# Patient Record
Sex: Male | Born: 2000
Health system: Southern US, Community
[De-identification: ages and names within clinical notes are randomized; demographics above are authoritative.]

## PROBLEM LIST (undated history)

## (undated) ENCOUNTER — Ambulatory Visit: Admission: EM | Payer: Self-pay | Source: Home / Self Care

## (undated) DIAGNOSIS — F913 Oppositional defiant disorder: Secondary | ICD-10-CM

## (undated) DIAGNOSIS — M24549 Contracture, unspecified hand: Secondary | ICD-10-CM

## (undated) DIAGNOSIS — M66849 Spontaneous rupture of other tendons, unspecified hand: Secondary | ICD-10-CM

## (undated) DIAGNOSIS — F819 Developmental disorder of scholastic skills, unspecified: Secondary | ICD-10-CM

## (undated) DIAGNOSIS — F909 Attention-deficit hyperactivity disorder, unspecified type: Secondary | ICD-10-CM

---
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
## 2018-01-28 ENCOUNTER — Ambulatory Visit (INDEPENDENT_AMBULATORY_CARE_PROVIDER_SITE_OTHER): Payer: Self-pay | Admitting: Family Medicine

## 2018-01-28 ENCOUNTER — Encounter: Payer: Self-pay | Admitting: Family Medicine

## 2018-01-28 VITALS — BP 137/60 | HR 60 | Temp 98.0°F | Wt 138.0 lb

## 2018-01-28 DIAGNOSIS — R197 Diarrhea, unspecified: Secondary | ICD-10-CM

## 2018-01-28 NOTE — Progress Notes (Signed)
       Samuel Yates is a 17 y.o. male who presents to Samuel Yates: [facility]ates today for diarrhea.  Samuel Yates has a 2-day history of diarrhea.  He is having approximately 5 watery stools per day.  He denies significant abdominal pain blood in the stool or vomiting.  He notes a little bit of abdominal cramping.  He notes symptoms are worse after he eats.  No sick contacts.  No fevers or chills.  No treatment tried yet.  He is having difficulty working.  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  He is missed work yesterday and today.   ROS as above:  Exam:  BP 137/60   Pulse 60   Temp 98 F (36.7 C) (Oral)   Wt 138 lb (62.6 kg)   BMI 22.27 kg/m  Wt Readings from Last 5 Encounters:  01/28/18 138 lb (62.6 kg) (25 %, Z= -0.67)*  12/06/17 130 lb (59 kg) (14 %, Z= -1.07)*  11/29/17 133 lb (60.3 kg) (18 %, Z= -0.91)*  10/30/17 136 lb (61.7 kg) (23 %, Z= -0.73)*  09/16/17 135 lb (61.2 kg) (22 %, Z= -0.76)*   * Growth percentiles are based on CDC (Boys, 2-20 Years) data.    Gen: Well NAD HEENT: EOMI,  MMM Lungs: Normal work of breathing. CTABL Heart: RRR no MRG Abd: NABS, Soft. Nondistended, Nontender no rebound or guarding.  No masses palpated. Exts: Brisk capillary refill, warm and well perfused.      Assessment and Plan: 17 y.o. male with viral enteritis.  Plan for treatment symptomatically with loperamide.  If not improving patient will let me know and I will send in Lomotil.  Recheck as needed.  Work note provided.   No orders of the defined types were placed in this encounter.  No orders of the defined types were placed in this encounter.    Historical information moved to improve visibility of documentation.  Past Medical History:  Diagnosis Date  . ADHD (attention deficit hyperactivity disorder)   . 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 Nontraumatic rupture of tendon of finger 08/2012   pulley rupture right small finger  . Oppositional defiant disorder    Past Surgical History:  Procedure Laterality Date  . HAND TENDON SURGERY Right 2010  . ORIF FINGER FRACTURE  12/06/2011   Procedure: OPEN REDUCTION INTERNAL FIXATION (ORIF) METACARPAL (FINGER) FRACTURE;  Surgeon: Samuel Ribas, MD;  Location: [facility];  Service: Orthopedics;  Laterality: Right;  RIGHT LONG AND RING METACARPAL FRACTURES  . TRIGGER FINGER RELEASE Right 09/09/2012   Procedure: RIGHT SMALL FINGER PULLEY RECONSTRUCTION;  Surgeon: Samuel Ribas, MD;  Location: [facility];  Service: Orthopedics;  Laterality: Right;   Social History   Tobacco Use  . Smoking status: Former Games developer  . Smokeless tobacco: Never Used  . Tobacco comment: mother smokes outside  Substance Use Topics  . Alcohol use: No   family history includes Asthma in his brother; Diabetes in his maternal grandfather; Heart disease in his maternal grandfather; Hypertension in his maternal grandfather and maternal grandmother.  Medications: No current outpatient medications on file.   No current facility-administered medications for this visit.    No Known Allergies   Discussed warning signs or symptoms. Please see discharge instructions. Patient expresses understanding.

## 2018-01-28 NOTE — Patient Instructions (Signed)
Thank you for coming in today. Take immodium. Get plenty of fluids.  Let me know if not doing well or getting better.  If your belly pain worsens, or you have high fever, bad vomiting, blood in your stool or black tarry stool go to the Emergency Room.    Viral Gastroenteritis, Adult Viral gastroenteritis is also known as the stomach flu. This condition is caused by certain germs (viruses). These germs can be passed from person to person very easily (are very contagious). This condition can cause sudden watery poop (diarrhea), fever, and throwing up (vomiting). Having watery poop and throwing up can make you feel weak and cause you to get dehydrated. Dehydration can make you tired and thirsty, make you have a dry mouth, and make it so you pee (urinate) less often. Older adults and people with other diseases or a weak defense system (immune system) are at higher risk for dehydration. It is important to replace the fluids that you lose from having watery poop and throwing up. Follow these instructions at home: Follow instructions from your doctor about how to care for yourself at home. Eating and drinking  Follow these instructions as told by your doctor:  Take an oral rehydration solution (ORS). This is a drink that is sold at pharmacies and stores.  Drink clear fluids in small amounts as you are able, such as: ? Water. ? Ice chips. ? Diluted fruit juice. ? Low-calorie sports drinks.  Eat bland, easy-to-digest foods in small amounts as you are able, such as: ? Bananas. ? Applesauce. ? Rice. ? Low-fat (lean) meats. ? Toast. ? Crackers.  Avoid fluids that have a lot of sugar or caffeine in them.  Avoid alcohol.  Avoid spicy or fatty foods.  General instructions  Drink enough fluid to keep your pee (urine) clear or pale yellow.  Wash your hands often. If you cannot use soap and water, use hand sanitizer.  Make sure that all people in your home wash their hands well and  often.  Rest at home while you get better.  Take over-the-counter and prescription medicines only as told by your doctor.  Watch your condition for any changes.  Take a warm bath to help with any burning or pain from having watery poop.  Keep all follow-up visits as told by your doctor. This is important. Contact a doctor if:  You cannot keep fluids down.  Your symptoms get worse.  You have new symptoms.  You feel light-headed or dizzy.  You have muscle cramps. Get help right away if:  You have chest pain.  You feel very weak or you pass out (faint).  You see blood in your throw-up.  Your throw-up looks like coffee grounds.  You have bloody or black poop (stools) or poop that look like tar.  You have a very bad headache, a stiff neck, or both.  You have a rash.  You have very bad pain, cramping, or bloating in your belly (abdomen).  You have trouble breathing.  You are breathing very quickly.  Your heart is beating very quickly.  Your skin feels cold and clammy.  You feel confused.  You have pain when you pee.  You have signs of dehydration, such as: ? Dark pee, hardly any pee, or no pee. ? Cracked lips. ? Dry mouth. ? Sunken eyes. ? Sleepiness. ? Weakness. This information is not intended to replace advice given to you by your health care provider. Make sure you discuss any questions you  have with your health care provider. Document Released: 10/24/2007 Document Revised: 11/25/2015 Document Reviewed: 01/11/2015 Elsevier Interactive Patient Education  2017 Elsevier Inc.   Loperamide tablets or capsules What is this medicine? LOPERAMIDE (loe PER a mide) is used to treat diarrhea. This medicine may be used for other purposes; ask your health care provider or pharmacist if you have questions. COMMON BRAND NAME(S): Anti-Diarrheal, Imodium A-D, K-Pek II What should I tell my health care provider before I take this medicine? They need to know if you  have any of these conditions: -a black or bloody stool -bacterial food poisoning -colitis or mucus in your stool -currently taking an antibiotic medication for an infection -fever -liver disease -severe abdominal pain, swelling or bulging -an unusual or allergic reaction to loperamide, other medicines, foods, dyes, or preservatives -pregnant or trying to get pregnant -breast-feeding How should I use this medicine? Take this medicine by mouth with a glass of water. Follow the directions on the prescription label. Take your doses at regular intervals. Do not take your medicine more often than directed. Talk to your pediatrician regarding the use of this medicine in children. Special care may be needed. Overdosage: If you think you have taken too much of this medicine contact a poison control center or emergency room at once. NOTE: This medicine is only for you. Do not share this medicine with others. What if I miss a dose? This does not apply. This medicine is not for regular use. Only take this medicine while you continue to have loose bowel movements. Do not take more medicine than recommended by the packaging label or by your healthcare professional. What may interact with this medicine? Do not take this medicine with any of the following medications: - alosetron This medicine may also interact with the following medications: -cimetidine -clarithromycin -erythromycin -gemfibrozil -itraconazole -ketoconazole -quinidine -quinine -ranitidine -ritonavir -saquinavir This list may not describe all possible interactions. Give your health care provider a list of all the medicines, herbs, non-prescription drugs, or dietary supplements you use. Also tell them if you smoke, drink alcohol, or use illegal drugs. Some items may interact with your medicine. What should I watch for while using this medicine? Do not take this medicine for more than 2 days without asking your doctor or health care  professional. Do not use doses higher than those prescribed by your doctor or listed on the label. Check with your doctor or health care professional right away if you develop a fever, severe abdominal pain, swelling or bulging, or if you have have bloody/black diarrhea or stools. You may get drowsy or dizzy. Do not drive, use machinery, or do anything that needs mental alertness until you know how this medicine affects you. Do not stand or sit up quickly, especially if you are an older patient. This reduces the risk of dizzy or fainting spells. Alcohol can increase possible drowsiness and dizziness. Avoid alcoholic drinks. Your mouth may get dry. Chewing sugarless gum or sucking hard candy, and drinking plenty of water may help. Contact your doctor if the problem does not go away or is severe. Drinking plenty of water can also help prevent dehydration that can occur with diarrhea. Elderly patients may have a more variable response to the effects of this medicine, and are more susceptible to the effects of dehydration. What side effects may I notice from receiving this medicine? Side effects that you should report to your doctor or health care professional as soon as possible: - allergic reactions like  skin rash, itching or hives, swelling of the face, lips, or tongue -bloated, swollen feeling in your abdomen -blurred vision -loss of appetite -signs and symptoms of a dangerous change in heartbeat or heart rhythm like chest pain; dizziness; fast or irregular heartbeat palpitations; feeling faint or lightheaded, falls; breathing problems -stomach pain Side effects that usually do not require medical attention (report to your doctor or health care professional if they continue or are bothersome): - constipation -drowsiness or dizziness -dry mouth -nausea, vomiting This list may not describe all possible side effects. Call your doctor for medical advice about side effects. You may report side effects  to FDA at 1-800-FDA-1088. Where should I keep my medicine? Keep out of the reach of children. Store at room temperature between 15 and 25 degrees C (59 and 77 degrees F). Keep container tightly closed. Throw away any unused medicine after the expiration date. NOTE: This sheet is a summary. It may not cover all possible information. If you have questions about this medicine, talk to your doctor, pharmacist, or health care provider.  2018 Elsevier/Gold Standard (2014-11-02 15:29:29)

## 2018-01-29 ENCOUNTER — Telehealth: Payer: Self-pay | Admitting: Family Medicine

## 2018-01-29 MED ORDER — DOXYCYCLINE HYCLATE 100 MG PO TABS: 100.0000 mg | ORAL_TABLET | Freq: Two times a day (BID) | ORAL | 0 refills | Status: DC

## 2018-01-29 MED FILL — DOXYCYCLINE HYCLATE 100 MG: 100 | 7 days supply | Qty: 14 | Fill #0

## 2018-01-29 NOTE — Telephone Encounter (Signed)
Rt index finger infection.  Tx doxycycline

## 2020-05-23 ENCOUNTER — Other Ambulatory Visit: Payer: Self-pay | Admitting: Physician Assistant

## 2020-05-23 MED ORDER — DOXYCYCLINE HYCLATE 100 MG PO TABS: 100.0000 mg | ORAL_TABLET | Freq: Two times a day (BID) | ORAL | 0 refills | Status: DC

## 2020-05-23 MED FILL — DOXYCYCLINE HYCLATE 100 MG: 100 | 7 days supply | Qty: 14 | Fill #0

## 2020-05-23 NOTE — Progress Notes (Signed)
Partner has chlamydia. Sent doxycycline. No sex for 7 days. Recheck in 1 month.

## 2020-06-14 ENCOUNTER — Ambulatory Visit (INDEPENDENT_AMBULATORY_CARE_PROVIDER_SITE_OTHER): Payer: Self-pay | Admitting: Physician Assistant

## 2020-06-14 ENCOUNTER — Other Ambulatory Visit: Payer: Self-pay | Admitting: Physician Assistant

## 2020-06-14 ENCOUNTER — Other Ambulatory Visit: Payer: Self-pay

## 2020-06-14 ENCOUNTER — Encounter: Payer: Self-pay | Admitting: Physician Assistant

## 2020-06-14 VITALS — BP 118/66 | HR 71 | Ht 67.0 in | Wt 142.0 lb

## 2020-06-14 DIAGNOSIS — F339 Major depressive disorder, recurrent, unspecified: Secondary | ICD-10-CM | POA: Insufficient documentation

## 2020-06-14 MED ORDER — FLUOXETINE HCL 20 MG PO TABS: 20.0000 mg | ORAL_TABLET | Freq: Every day | ORAL | 0 refills | Status: DC

## 2020-06-14 MED FILL — FLUOXETINE HCL 20 MG TABS: 20 | 90 days supply | Qty: 90 | Fill #0

## 2020-06-14 NOTE — Progress Notes (Signed)
Subjective:    Patient ID: Samuel Yates, male    DOB: 2001-03-16, 20 y.o.   MRN: [id number]  HPI  Patient is a 20 year old male with recurrent depression symptoms who presents to the clinic today to start medication.  He has had ongoing depression since he was 20 years old.  He has never been prescribed medication but his mother recently gave him Prozac 20 mg to try and it worked great.  He felt so much better.  He took it for about a month.  Since he has been off he has been really struggling.  He does not have a lot of motivation and just feels like he has no energy.  He denies any problems sleeping.  He does report some impulsivity but denies any hallucinations.  He denies any suicidal thoughts or homicidal idealizations.  .. Active Ambulatory Problems    Diagnosis Date Noted  . Episodic tension-type headache, not intractable 10/13/2015  . Depression, recurrent (HCC) 06/14/2020   Resolved Ambulatory Problems    Diagnosis Date Noted  . Exposure to chlamydia 07/31/2017  . Moderate right ankle sprain 09/17/2017  . Dysuria 11/29/2017   Past Medical History:  Diagnosis Date  . ADHD (attention deficit hyperactivity disorder)   . 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 Learning difficulty   . Nontraumatic rupture of tendon of finger 08/2012  . Oppositional defiant disorder       Review of Systems  All other systems reviewed and are negative.      Objective:   Physical Exam Vitals reviewed.  Constitutional:      Appearance: Normal appearance.  Cardiovascular:     Rate and Rhythm: Normal rate.  Pulmonary:     Effort: Pulmonary effort is normal.  Neurological:     General: No focal deficit present.     Mental Status: He is alert and oriented to person, place, and time.  Psychiatric:        Mood and Affect: Mood normal.     .. Depression screen [facility] 2/9 06/14/2020 07/31/2017  Decreased Interest 2 0  Down, Depressed, Hopeless 2 0  PHQ - 2 Score 4 0  Altered sleeping 2  -  Tired, decreased energy 2 -  Change in appetite 2 -  Feeling bad or failure about yourself  2 -  Trouble concentrating 2 -  Moving slowly or fidgety/restless 2 -  Suicidal thoughts 1 -  PHQ-9 Score 17 -  Difficult doing work/chores Somewhat difficult -   .. GAD 7 : Generalized Anxiety Score 06/14/2020  Nervous, Anxious, on Edge 1  Control/stop worrying 2  Worry too much - different things 2  Trouble relaxing 2  Restless 2  Easily annoyed or irritable 2  Afraid - awful might happen 2  Total GAD 7 Score 13  Anxiety Difficulty Somewhat difficult          Assessment & Plan:  .Marland KitchenEldridge was seen today for depression.  Diagnoses and all orders for this visit:  Depression, recurrent (HCC) -     FLUoxetine (PROZAC) 20 MG tablet; Take 1 tablet (20 mg total) by mouth daily. -     COMPLETE METABOLIC PANEL WITH GFR   PHQ and GAD both elevated.  Will start Prozac since he has previously had great benefit from this.  Start with 1/2 tablet for 7 days then increase to 1 full tablet.  Patient advised to get CMP in about 4 to 6 weeks.  We will repeat PHQ-9 and GAD-7 in  3 months and refill accordingly.  Discussed other conservative ways to treat depression.  Follow-up as needed.

## 2020-06-14 NOTE — Patient Instructions (Signed)
Major Depressive Disorder, Adult Major depressive disorder is a mental health condition. This disorder affects feelings. It can also affect the body. Symptoms of this condition last most of the day, almost every day, for 2 weeks. This disorder can affect:  Relationships.  Daily activities, such as work and school.  Activities that you normally like to do. What are the causes? The cause of this condition is not known. The disorder is likely caused by a mix of things, including:  Your personality, such as being a shy person.  Your behavior, or how you act toward others.  Your thoughts and feelings.  Too much alcohol or drugs.  How you react to stress.  Health and mental problems that you have had for a long time.  Things that hurt you in the past (trauma).  Big changes in your life, such as divorce. What increases the risk? The following factors may make you more likely to develop this condition:  Having family members with depression.  Being a woman.  Problems in the family.  Low levels of some brain chemicals.  Things that caused you pain as a child, especially if you lost a parent or were abused.  A lot of stress in your life, such as from: ? Living without basic needs of life, such as food and shelter. ? Being treated poorly because of race, sex, or religion (discrimination).  Health and mental problems that you have had for a long time. What are the signs or symptoms? The main symptoms of this condition are:  Being sad all the time.  Being grouchy all the time.  Loss of interest in things and activities. Other symptoms include:  Sleeping too much or too little.  Eating too much or too little.  Gaining or losing weight, without knowing why.  Feeling tired or having low energy.  Being restless and weak.  Feeling hopeless, worthless, or guilty.  Trouble thinking clearly or making decisions.  Thoughts of hurting yourself or others, or thoughts of  ending your life.  Spending a lot of time alone.  Inability to complete common tasks of daily life. If you have very bad MDD, you may:  Believe things that are not true.  Hear, see, taste, or feel things that are not there.  Have mild depression that lasts for at least 2 years.  Feel very sad and hopeless.  Have trouble speaking or moving. How is this treated? This condition may be treated with:  Talk therapy. This teaches you to know bad thoughts, feelings, and actions and how to change them. ? This can also help you to communicate with others. ? This can be done with members of your family.  Medicines. These can be used to treat worry (anxiety), depression, or low levels of chemicals in the brain.  Lifestyle changes. You may need to: ? Limit alcohol use. ? Limit drug use. ? Get regular exercise. ? Get plenty of sleep. ? Make healthy eating choices. ? Spend more time outdoors.  Brain stimulation. This treatment excites the brain. This is done when symptoms are very bad or have not gotten better with other treatments. Follow these instructions at home: Activity  Get regular exercise as told.  Spend time outdoors as told.  Make time to do the things you enjoy.  Find ways to deal with stress. Try to: ? Meditate. ? Do deep breathing. ? Spend time in nature. ? Keep a journal.  Return to your normal activities as told by your doctor.   Ask your doctor what activities are safe for you. Alcohol and drug use  If you drink alcohol: ? Limit how much you use to:  0-1 drink a day for women.  0-2 drinks a day for men. ? Be aware of how much alcohol is in your drink. In the [place] one drink equals one 12 oz bottle of beer (355 mL), one 5 oz glass of wine (148 mL), or one 1 oz glass of hard liquor (44 mL).  Talk to your doctor about: ? Alcohol use. Alcohol can affect some medicines. ? Any drug use. General instructions  Take over-the-counter and prescription medicines  and herbal preparations only as told by your doctor.  Eat a healthy diet.  Get a lot of sleep.  Think about joining a support group. Your doctor may be able to suggest one.  Keep all follow-up visits as told by your doctor. This is important.   Where to find more information:  National Alliance on Mental Illness: www.nami.org  [place] National Institute of Mental Health: www.nimh.nih.gov  American Psychiatric Association: www.psychiatry.org/patients-families/ Contact a doctor if:  Your symptoms get worse.  You get new symptoms. Get help right away if:  You hurt yourself.  You have serious thoughts about hurting yourself or others.  You see, hear, taste, smell, or feel things that are not there. If you ever feel like you may hurt yourself or others, or have thoughts about taking your own life, get help right away. Go to your nearest emergency department or:  Call your local emergency services (911 in the [place]  Call a suicide crisis helpline, such as the National Suicide Prevention Lifeline at 1-800-273-8255. This is open 24 hours a day in the [place]  Text the Crisis Text Line at 741741 (in the [place] Summary  Major depressive disorder is a mental health condition. This disorder affects feelings. Symptoms of this condition last most of the day, almost every day, for 2 weeks.  The symptoms of this disorder can cause problems with relationships and with daily activities.  There are treatments and support for people who get this disorder. You may need more than one type of treatment.  Get help right away if you have serious thoughts about hurting yourself or others. This information is not intended to replace advice given to you by your health care provider. Make sure you discuss any questions you have with your health care provider. Document Revised: 04/18/2019 Document Reviewed: 04/18/2019 Elsevier Patient Education  2021 Elsevier Inc.  

## 2020-11-14 ENCOUNTER — Ambulatory Visit: Payer: Self-pay | Admitting: Physician Assistant

## 2020-11-25 ENCOUNTER — Ambulatory Visit (INDEPENDENT_AMBULATORY_CARE_PROVIDER_SITE_OTHER): Payer: Self-pay | Admitting: Sports Medicine

## 2020-11-25 ENCOUNTER — Other Ambulatory Visit (HOSPITAL_BASED_OUTPATIENT_CLINIC_OR_DEPARTMENT_OTHER): Payer: Self-pay

## 2020-11-25 ENCOUNTER — Encounter: Payer: Self-pay | Admitting: Sports Medicine

## 2020-11-25 ENCOUNTER — Other Ambulatory Visit: Payer: Self-pay

## 2020-11-25 ENCOUNTER — Ambulatory Visit (INDEPENDENT_AMBULATORY_CARE_PROVIDER_SITE_OTHER): Payer: Self-pay

## 2020-11-25 DIAGNOSIS — M25519 Pain in unspecified shoulder: Secondary | ICD-10-CM

## 2020-11-25 DIAGNOSIS — M542 Cervicalgia: Secondary | ICD-10-CM

## 2020-11-25 DIAGNOSIS — M25512 Pain in left shoulder: Secondary | ICD-10-CM

## 2020-11-25 MED ORDER — PREDNISONE 50 MG PO TABS
ORAL_TABLET | ORAL | 0 refills | Status: DC
  Filled 2020-11-25: qty 5, fill #0

## 2020-11-25 MED ORDER — CYCLOBENZAPRINE HCL 10 MG PO TABS
ORAL_TABLET | ORAL | 0 refills | Status: DC
  Filled 2020-11-25: qty 30, fill #0

## 2020-11-25 MED ORDER — PREDNISONE 50 MG PO TABS: ORAL_TABLET | ORAL | 0 refills | Status: DC

## 2020-11-25 MED ORDER — CYCLOBENZAPRINE HCL 10 MG PO TABS: ORAL_TABLET | ORAL | 0 refills | Status: DC

## 2020-11-25 NOTE — Assessment & Plan Note (Signed)
This is a pleasant 20 year old male, he works as a Curator, for the past couple weeks he has had some pain over the lateral aspect and the dorsal aspect of his left shoulder, worse when turning his head to the right. On exam he does have some impingement signs but the dominant precipitator of pain is neck movement. I do think the next primary pain generator, we will treat conservatively with a 5-day burst of prednisone, Flexeril at night, x-rays of his neck and shoulder, cervical spine conditioning exercises. 25 pound lifting restriction. Return to see me in 3 to 4 weeks, we will likely do advanced imaging on whichever structure declares itself as the primary pain generator at the follow-up visit.

## 2020-11-25 NOTE — Addendum Note (Signed)
Addended by: Monica Becton on: 11/25/2020 03:46 PM   Modules accepted: Orders

## 2020-11-25 NOTE — Progress Notes (Signed)
    Procedures performed today:    None.  Independent interpretation of notes and tests performed by another provider:   None.  Brief History, Exam, Impression, and Recommendations:    Neck and shoulder pain This is a pleasant 20 year old male, he works as a Curator, for the past couple weeks he has had some pain over the lateral aspect and the dorsal aspect of his left shoulder, worse when turning his head to the right. On exam he does have some impingement signs but the dominant precipitator of pain is neck movement. I do think the next primary pain generator, we will treat conservatively with a 5-day burst of prednisone, Flexeril at night, x-rays of his neck and shoulder, cervical spine conditioning exercises. 25 pound lifting restriction. Return to see me in 3 to 4 weeks, we will likely do advanced imaging on whichever structure declares itself as the primary pain generator at the follow-up visit.    ___________________________________________ Ihor Austin. Benjamin Stain, M.D., ABFM., CAQSM. Primary Care and Sports Medicine [facility]  Adjunct Instructor of Family Medicine  [employer]

## 2020-12-21 ENCOUNTER — Ambulatory Visit: Payer: Self-pay | Admitting: Sports Medicine

## 2021-02-24 ENCOUNTER — Telehealth (INDEPENDENT_AMBULATORY_CARE_PROVIDER_SITE_OTHER): Payer: Self-pay | Admitting: Family Medicine

## 2021-02-24 DIAGNOSIS — Z91199 Patient's noncompliance with other medical treatment and regimen due to unspecified reason: Secondary | ICD-10-CM

## 2021-02-24 NOTE — Progress Notes (Signed)
Attempted to contact patient at 820 am, no answer. Unable to leave a vm msg. Vm box not set up.

## 2021-02-24 NOTE — Progress Notes (Signed)
Text to connect to virtual visit sent at [phone] and [phone]. Phone call at 0900. No answer. No voicemail box.  No show for appointment.

## 2021-07-24 ENCOUNTER — Telehealth: Payer: Self-pay

## 2021-07-24 NOTE — Telephone Encounter (Signed)
Transition Care Management Unsuccessful Follow-up Telephone Call ? ?Date of discharge and from where:  07/23/2021 from [facility] ? ?Attempts:  1st Attempt ? ?Reason for unsuccessful TCM follow-up call:  Unable to leave message ? ? ? ?

## 2021-07-25 NOTE — Telephone Encounter (Signed)
Transition Care Management Unsuccessful Follow-up Telephone Call ? ?Date of discharge and from where:  07/23/2021 from [facility] ? ?Attempts:  2nd Attempt ? ?Reason for unsuccessful TCM follow-up call:  Unable to leave message ? ? ? ?

## 2021-07-26 NOTE — Telephone Encounter (Signed)
Transition Care Management Unsuccessful Follow-up Telephone Call ? ?Date of discharge and from where:  07/23/21 from [facility] ? ?Attempts:  3rd Attempt ? ?Reason for unsuccessful TCM follow-up call:  Unable to reach patient ? ?  ?

## 2021-09-17 ENCOUNTER — Encounter (HOSPITAL_COMMUNITY): Payer: Self-pay | Admitting: Emergency Medicine

## 2021-09-17 ENCOUNTER — Emergency Department (HOSPITAL_COMMUNITY)
Admission: EM | Admit: 2021-09-17 | Discharge: 2021-09-17 | Discharge status: Against Medical Advice | Payer: No Typology Code available for payment source | Attending: Emergency Medicine | Admitting: Emergency Medicine

## 2021-09-17 ENCOUNTER — Other Ambulatory Visit: Payer: Self-pay

## 2021-09-17 DIAGNOSIS — Y92410 Unspecified street and highway as the place of occurrence of the external cause: Secondary | ICD-10-CM | POA: Diagnosis not present

## 2021-09-17 DIAGNOSIS — M542 Cervicalgia: Secondary | ICD-10-CM | POA: Insufficient documentation

## 2021-09-17 DIAGNOSIS — Z5321 Procedure and treatment not carried out due to patient leaving prior to being seen by health care provider: Secondary | ICD-10-CM | POA: Diagnosis not present

## 2021-09-17 DIAGNOSIS — M545 Low back pain, unspecified: Secondary | ICD-10-CM | POA: Insufficient documentation
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
## 2021-09-17 NOTE — ED Notes (Signed)
Called for vitals x3 °

## 2021-09-19 ENCOUNTER — Telehealth: Payer: Self-pay | Admitting: General Practice

## 2021-09-19 NOTE — Telephone Encounter (Signed)
Transition Care Management Unsuccessful Follow-up Telephone Call ? ?Date of discharge and from where:  09/17/21 from [facility] ? ?Attempts:  1st Attempt ? ?Reason for unsuccessful TCM follow-up call:  Unable to leave message ? ?  ?

## 2021-09-20 NOTE — Telephone Encounter (Signed)
Transition Care Management Unsuccessful Follow-up Telephone Call ? ?Date of discharge and from where:  09/17/21 from [facility] hospital ? ?Attempts:  2nd Attempt ? ?Reason for unsuccessful TCM follow-up call:  Unable to leave message ? ?  ?

## 2021-09-22 NOTE — Telephone Encounter (Signed)
Transition Care Management Unsuccessful Follow-up Telephone Call ? ?Date of discharge and from where:  09/17/21 from [facility] ? ?Attempts:  3rd Attempt ? ?Reason for unsuccessful TCM follow-up call:  Unable to leave message ? ?  ?

## 2021-11-14 ENCOUNTER — Ambulatory Visit: Payer: Self-pay | Admitting: Physician Assistant

## 2022-01-23 ENCOUNTER — Telehealth: Payer: Self-pay | Admitting: Physician Assistant

## 2022-01-23 MED ORDER — NIRMATRELVIR/RITONAVIR (PAXLOVID)TABLET: 3.0000 | ORAL_TABLET | Freq: Two times a day (BID) | ORAL | 0 refills | Status: AC

## 2022-01-23 NOTE — Telephone Encounter (Signed)
Wife positive for covid and he also has same symptoms. Sent paxlovid. If symptoms worsening please make office visit.

## 2022-01-31 IMAGING — DX DG SHOULDER 2+V*L*
3 series · 3 of 3 positions shown · non-contrast
Comparison: None.

CLINICAL DATA: Pain.

EXAM:
LEFT SHOULDER - 2+ VIEW

[shoulder grashey]
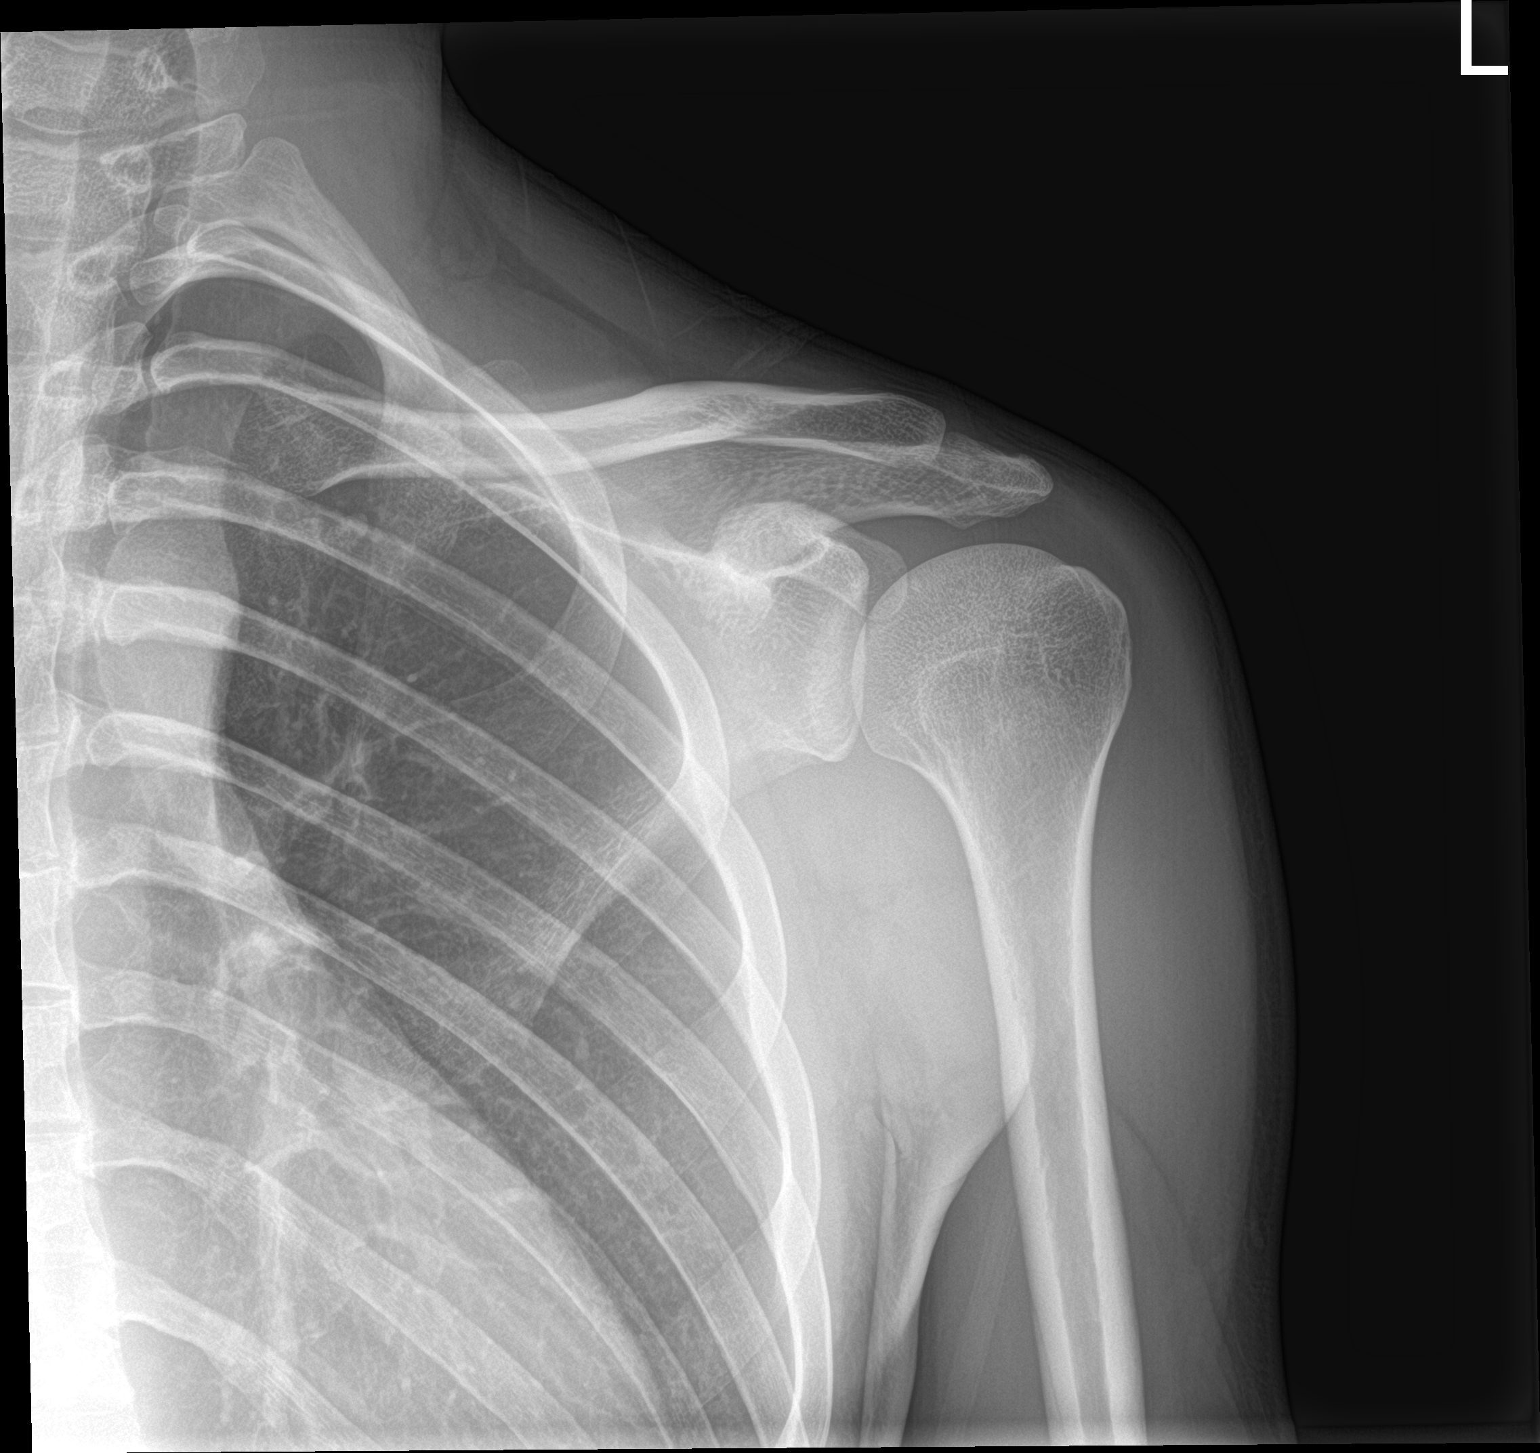

[shoulder y view]
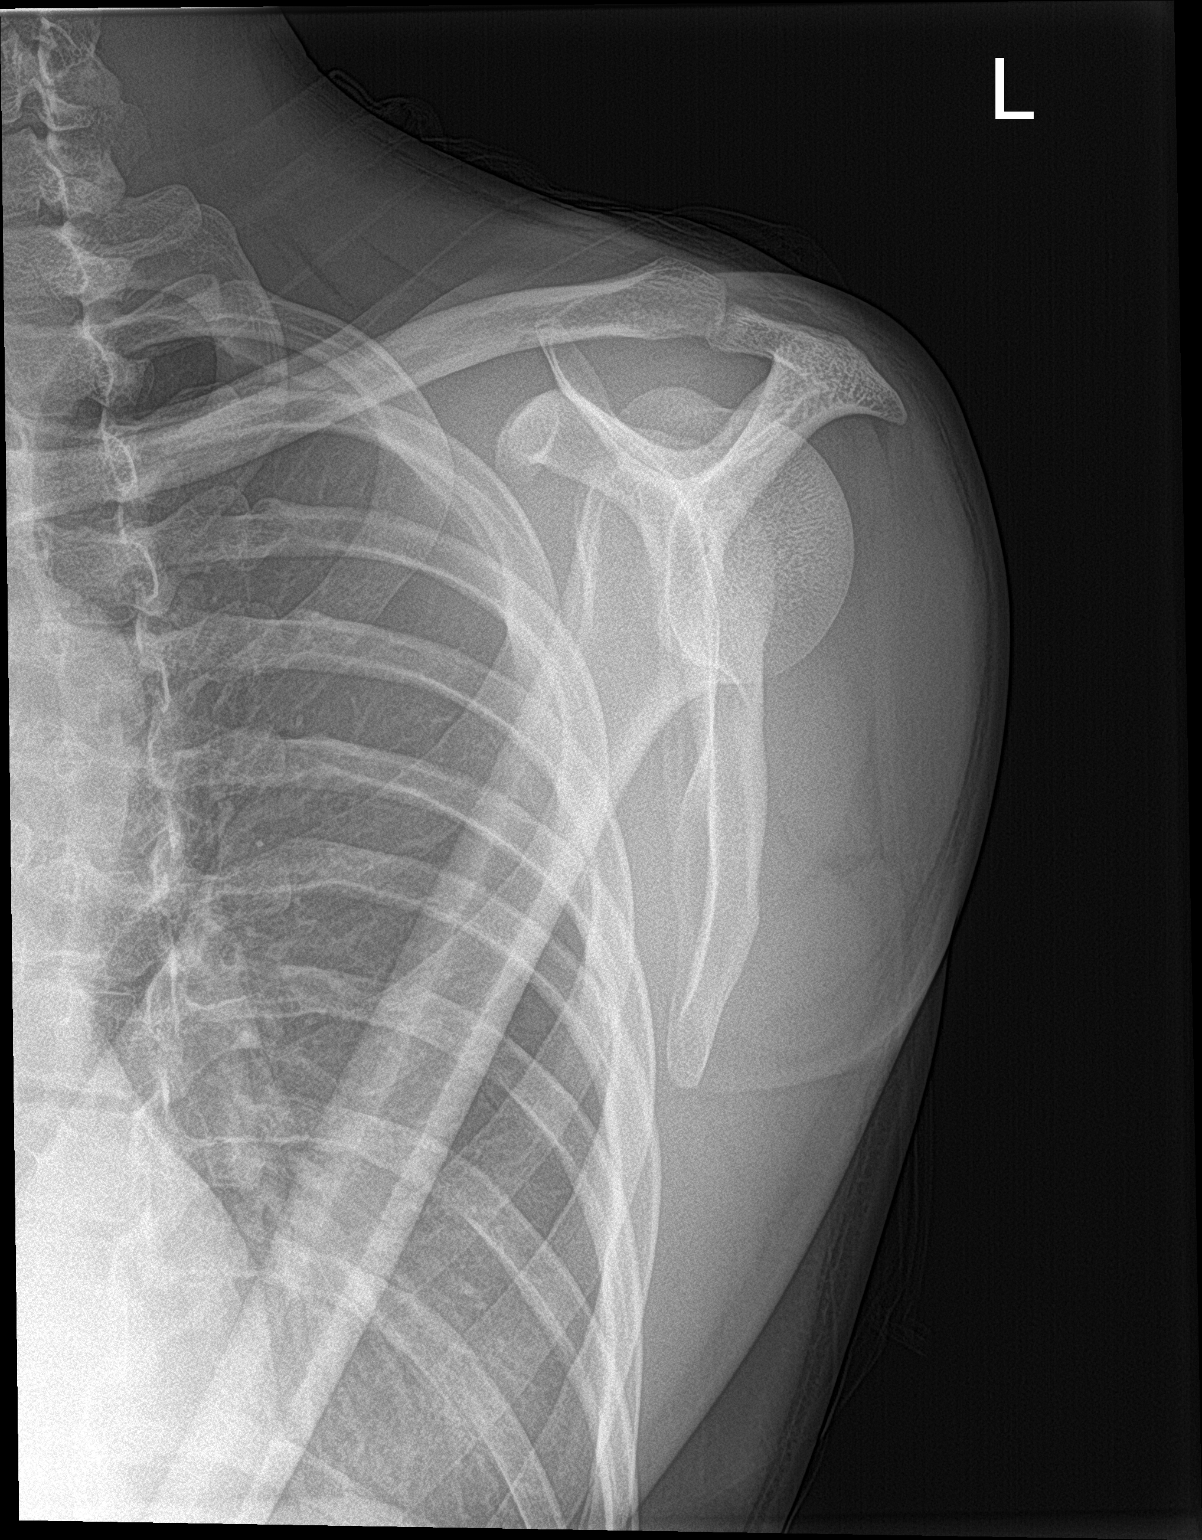

[shoulder axillary]
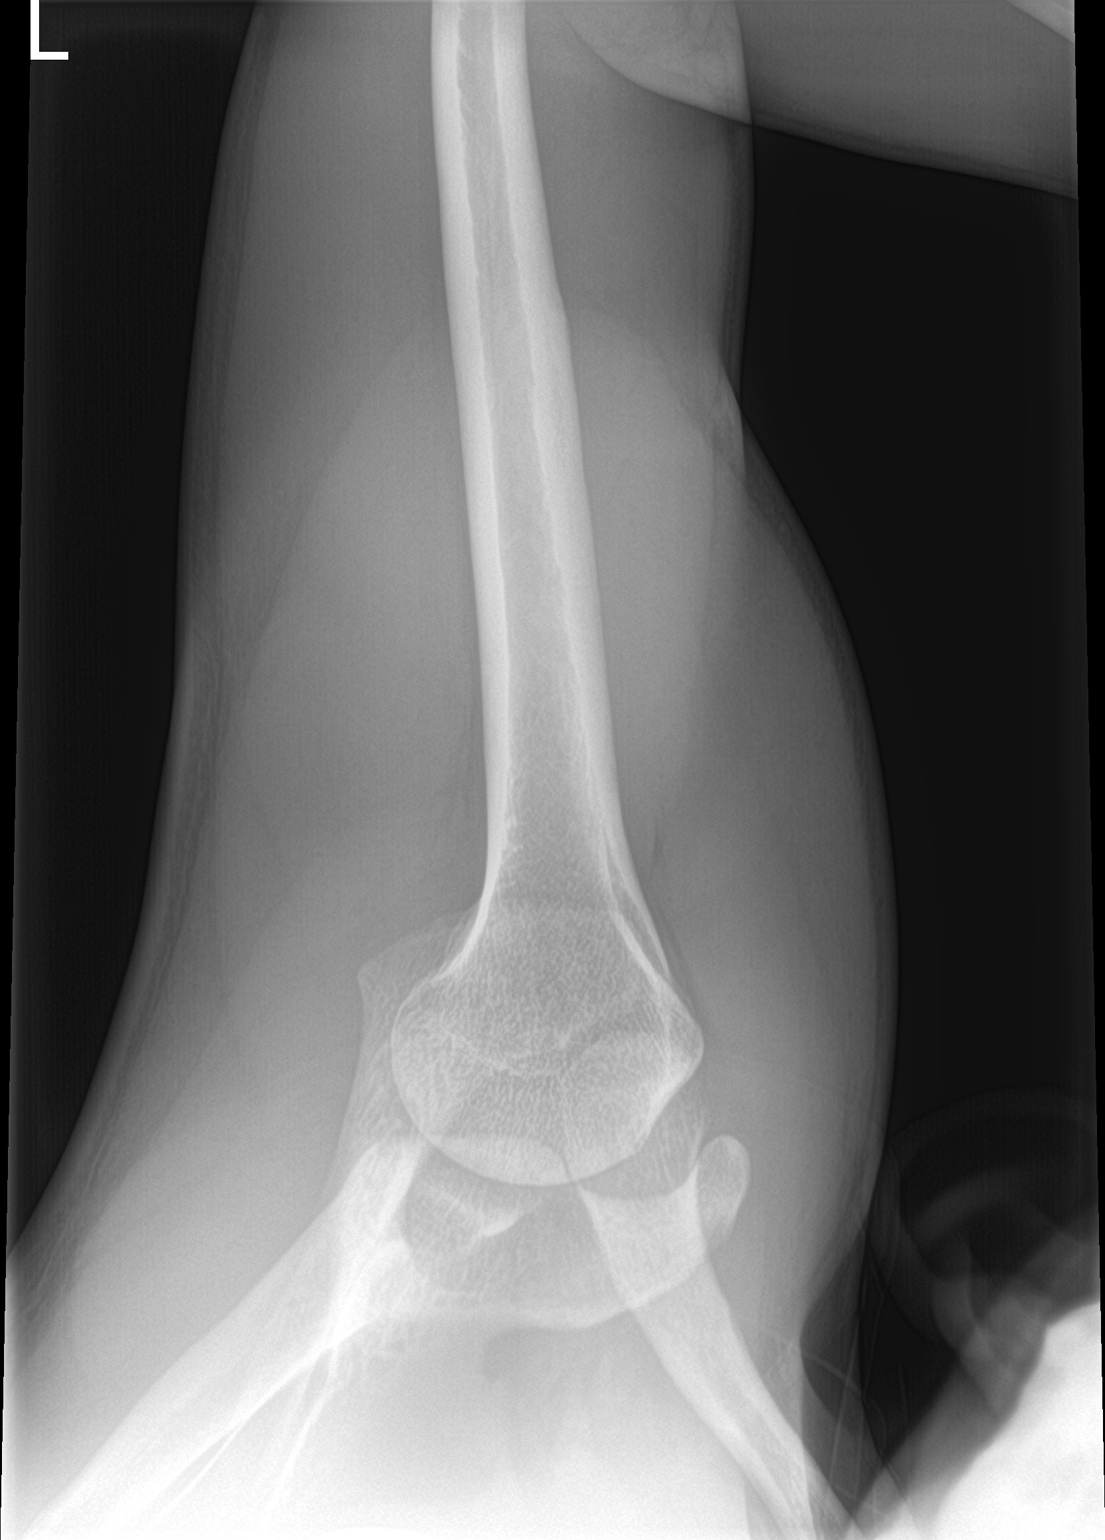

[3 of 3 positions shown; findings below may reference images not displayed]

FINDINGS: There is no evidence of fracture or dislocation. There is no
evidence of arthropathy or other focal bone abnormality. Soft
tissues are unremarkable.
IMPRESSION: Negative.

## 2022-06-07 ENCOUNTER — Encounter: Payer: Self-pay | Admitting: Family Medicine

## 2022-06-07 ENCOUNTER — Ambulatory Visit (INDEPENDENT_AMBULATORY_CARE_PROVIDER_SITE_OTHER): Payer: Self-pay | Admitting: Family Medicine

## 2022-06-07 VITALS — BP 126/75 | HR 58 | Ht 67.0 in | Wt 179.0 lb

## 2022-06-07 DIAGNOSIS — Z23 Encounter for immunization: Secondary | ICD-10-CM

## 2022-06-07 DIAGNOSIS — R1084 Generalized abdominal pain: Secondary | ICD-10-CM | POA: Insufficient documentation

## 2022-06-07 DIAGNOSIS — K5900 Constipation, unspecified: Secondary | ICD-10-CM

## 2022-06-07 NOTE — Progress Notes (Signed)
[name] - 22 y.o. male MRN [id number]  Date of birth: 08-20-2000  Subjective Chief Complaint  Patient presents with   Abdominal Pain    HPI Samuel Yates is a 22 year old male here today with complaint of abdominal pain.  Pain initially started on the left side of his abdomen however alternates between left and right side at this point.  He has had some constipation but that seems to be improved at this point.  He did note some blood with a bowel movement previously, however this was when he had a hard bowel movement.  Denies dark tarry stools.  He has had some decreased appetite and nausea.  He denies fever, chills heartburn symptoms or other recent illness.  No history of frequent NSAIDs.  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   Nontraumatic rupture of tendon of finger 08/2012   pulley rupture right small finger   Oppositional defiant disorder     Past Surgical History:  Procedure Laterality Date   HAND TENDON SURGERY Right 2010   ORIF FINGER FRACTURE  12/06/2011   Procedure: OPEN REDUCTION INTERNAL FIXATION (ORIF) METACARPAL (FINGER) FRACTURE;  Surgeon: Tennis Must, MD;  Location: [place];  Service: Orthopedics;  Laterality: Right;  RIGHT LONG AND RING METACARPAL FRACTURES   TRIGGER FINGER RELEASE Right 09/09/2012   Procedure: RIGHT SMALL FINGER PULLEY RECONSTRUCTION;  Surgeon: Tennis Must, MD;  Location: [facility];  Service: Orthopedics;  Laterality: Right;    Social History   Socioeconomic History   Marital status: Single    Spouse name: Not on file   Number of children: Not on file   Years of education: Not on file   Highest education level: Not on file  Occupational History   Not on  file  Tobacco Use   Smoking status: Former   Smokeless tobacco: Never   Tobacco comments:    mother smokes outside  Substance and Sexual Activity   Alcohol use: No   Drug use: No   Sexual activity: Never  Other Topics Concern   Not on file  Social History Narrative   Not on file   Social Determinants of Health   Financial Resource Strain: Not on file  Food Insecurity: Not on file  Transportation Needs: Not on file  Physical Activity: Not on file  Stress: Not on file  Social Connections: Not on file    Family History  Problem Relation Age of Onset   Asthma Brother    Hypertension Maternal Grandmother    Diabetes Maternal Grandfather        diet-controlled   Hypertension Maternal Grandfather    Heart disease Maternal Grandfather        atrial fib., cardiomyopathy    Health Maintenance  Topic Date Due   Hepatitis C Screening  Never done   DTaP/Tdap/Td (7 - Tdap) 12/30/2019   COVID-19 Vaccine (3 - 2023-24 season) 06/24/2023 (Originally 01/19/2022)   INFLUENZA VACCINE  Completed   HPV VACCINES  Completed   HIV Screening  Completed     ----------------------------------------------------------------------------------------------------------------------------------------------------------------------------------------------------------------- Physical Exam BP 126/75   Pulse (!) 58   Ht 5\' 7"  (1.702 m)   Wt 179 lb (81.2 kg)  SpO2 98%   BMI 28.04 kg/m   Physical Exam Constitutional:      Appearance: He is well-developed.  HENT:     Head: Normocephalic and atraumatic.  Cardiovascular:     Rate and Rhythm: Normal rate and regular rhythm.  Pulmonary:     Effort: Pulmonary effort is normal.     Breath sounds: Normal breath sounds.  Abdominal:     Tenderness: There is abdominal tenderness (Tenderness palpation along the lower abdomen and left lower quadrant.).  Musculoskeletal:     Cervical back: Neck supple.  Neurological:     Mental Status: He is alert.   Psychiatric:        Mood and Affect: Mood normal.        Behavior: Behavior normal.     ------------------------------------------------------------------------------------------------------------------------------------------------------------------------------------------------------------------- Assessment and Plan  Generalized abdominal pain Also related to constipation.  Encouraged to increase fiber and fluid intake.  Checking labs including CMP and CBC today.  KUB ordered.   No orders of the defined types were placed in this encounter.   No follow-ups on file.    This visit occurred during the SARS-CoV-2 public health emergency.  Safety protocols were in place, including screening questions prior to the visit, additional usage of staff PPE, and extensive cleaning of exam room while observing appropriate contact time as indicated for disinfecting solutions.

## 2022-06-07 NOTE — Assessment & Plan Note (Signed)
Also related to constipation.  Encouraged to increase fiber and fluid intake.  Checking labs including CMP and CBC today.  KUB ordered.

## 2022-06-08 LAB — COMPLETE METABOLIC PANEL WITH GFR
AG Ratio: 2.1 (calc) (ref 1.0–2.5)
ALT: 15 U/L (ref 9–46)
AST: 15 U/L (ref 10–40)
Albumin: 4.9 g/dL (ref 3.6–5.1)
Alkaline phosphatase (APISO): 58 U/L (ref 36–130)
BUN: 15 mg/dL (ref 7–25)
CO2: 25 mmol/L (ref 20–32)
Calcium: 10.5 mg/dL — ABNORMAL HIGH (ref 8.6–10.3)
Chloride: 103 mmol/L (ref 98–110)
Creat: 0.83 mg/dL (ref 0.60–1.24)
Globulin: 2.3 g/dL (calc) (ref 1.9–3.7)
Glucose, Bld: 90 mg/dL (ref 65–99)
Potassium: 4.7 mmol/L (ref 3.5–5.3)
Sodium: 139 mmol/L (ref 135–146)
Total Bilirubin: 0.4 mg/dL (ref 0.2–1.2)
Total Protein: 7.2 g/dL (ref 6.1–8.1)
eGFR: 126 mL/min/{1.73_m2} (ref >=60)

## 2022-06-08 LAB — CBC WITH DIFFERENTIAL/PLATELET
Absolute Monocytes: 561 cells/uL (ref 200–950)
Basophils Absolute: 49 cells/uL (ref 0–200)
Basophils Relative: 0.8 %
Eosinophils Absolute: 122 cells/uL (ref 15–500)
Eosinophils Relative: 2 %
HCT: 45.8 % (ref 38.5–50.0)
Hemoglobin: 16.1 g/dL (ref 13.2–17.1)
Lymphs Abs: 1970 cells/uL (ref 850–3900)
MCH: 30.4 pg (ref 27.0–33.0)
MCHC: 35.2 g/dL (ref 32.0–36.0)
MCV: 86.4 fL (ref 80.0–100.0)
MPV: 11.5 fL (ref 7.5–12.5)
Monocytes Relative: 9.2 %
Neutro Abs: 3398 cells/uL (ref 1500–7800)
Neutrophils Relative %: 55.7 %
Platelets: 292 10*3/uL (ref 140–400)
RBC: 5.3 10*6/uL (ref 4.20–5.80)
RDW: 12.7 % (ref 11.0–15.0)
Total Lymphocyte: 32.3 %
WBC: 6.1 10*3/uL (ref 3.8–10.8)

## 2022-06-13 ENCOUNTER — Telehealth: Payer: Self-pay

## 2022-06-13 NOTE — Telephone Encounter (Signed)
Advised the patient of lab results per Dr. Zigmund Daniel notes.

## 2023-01-24 ENCOUNTER — Ambulatory Visit (INDEPENDENT_AMBULATORY_CARE_PROVIDER_SITE_OTHER): Payer: Medicaid Other | Admitting: Medical-Surgical

## 2023-01-24 ENCOUNTER — Encounter: Payer: Self-pay | Admitting: Medical-Surgical

## 2023-01-24 VITALS — BP 123/77 | HR 62 | Resp 20 | Ht 67.0 in | Wt 169.0 lb

## 2023-01-24 DIAGNOSIS — F339 Major depressive disorder, recurrent, unspecified: Secondary | ICD-10-CM | POA: Diagnosis not present

## 2023-01-24 DIAGNOSIS — Z23 Encounter for immunization: Secondary | ICD-10-CM | POA: Diagnosis not present

## 2023-01-24 MED ORDER — FLUOXETINE HCL 20 MG PO CAPS: 20.0000 mg | ORAL_CAPSULE | Freq: Every day | ORAL | 3 refills | Status: DC

## 2023-01-24 NOTE — Progress Notes (Signed)
        Established patient visit  History, exam, impression, and plan:  1. Depression, recurrent (HCC) Pleasant 22 year old male presenting today with a history of depression that was previously treated with fluoxetine 20 mg daily.  Reports that he has been off of this medication for "a while".  Recent worsening of depressive symptoms but also suffering from anxiety.  Denies SI/HI.  Mood, affect, thought pattern, cognition, and speech normal.  Not currently doing counseling and declined referral. Fluoxetine was previously well-tolerated so restarting at 20 mg daily.  Plan to follow-up in 4-6 weeks to evaluate tolerance, effectiveness, and need for further dose changes. Patient verbalized understanding and is agreeable.  2. Need for influenza vaccination Flu vaccine given in office today. - Flu vaccine trivalent PF, 6mos and older(Flulaval,Afluria,Fluarix,Fluzone)     01/24/2023   11:19 AM 06/14/2020    3:04 PM 07/31/2017    1:59 PM  Depression screen PHQ 2/9  Decreased Interest 2 2 0  Down, Depressed, Hopeless 2 2 0  PHQ - 2 Score 4 4 0  Altered sleeping 3 2   Tired, decreased energy 2 2   Change in appetite 1 2   Feeling bad or failure about yourself  2 2   Trouble concentrating 2 2   Moving slowly or fidgety/restless 2 2   Suicidal thoughts 0 1   PHQ-9 Score 16 17   Difficult doing work/chores Somewhat difficult Somewhat difficult       01/24/2023   11:20 AM 06/14/2020    3:04 PM  GAD 7 : Generalized Anxiety Score  Nervous, Anxious, on Edge 2 1  Control/stop worrying 2 2  Worry too much - different things 2 2  Trouble relaxing 2 2  Restless 2 2  Easily annoyed or irritable 3 2  Afraid - awful might happen 2 2  Total GAD 7 Score 15 13  Anxiety Difficulty Somewhat difficult Somewhat difficult   Procedures performed this visit: None.  Return in about 6 weeks (around 03/07/2023) for mood follow up.  __________________________________ Thayer Ohm, DNP, APRN,  FNP-BC Primary Care and Sports Medicine [facility] [place]

## 2023-03-06 ENCOUNTER — Ambulatory Visit (INDEPENDENT_AMBULATORY_CARE_PROVIDER_SITE_OTHER): Payer: Medicaid Other | Admitting: Physician Assistant

## 2023-03-06 ENCOUNTER — Encounter: Payer: Self-pay | Admitting: Physician Assistant

## 2023-03-06 VITALS — BP 131/88 | HR 56 | Ht 67.0 in | Wt 168.0 lb

## 2023-03-06 DIAGNOSIS — F5101 Primary insomnia: Secondary | ICD-10-CM | POA: Insufficient documentation

## 2023-03-06 DIAGNOSIS — F339 Major depressive disorder, recurrent, unspecified: Secondary | ICD-10-CM | POA: Diagnosis not present

## 2023-03-06 MED ORDER — TRAZODONE HCL 50 MG PO TABS: 25.0000 mg | ORAL_TABLET | Freq: Every evening | ORAL | 3 refills | Status: AC | PRN

## 2023-03-06 MED ORDER — FLUOXETINE HCL 40 MG PO CAPS: 40.0000 mg | ORAL_CAPSULE | Freq: Every day | ORAL | 3 refills | Status: AC

## 2023-03-06 NOTE — Progress Notes (Signed)
Established Patient Office Visit  Subjective   Patient ID: Samuel Yates, male    DOB: 2000/05/29  Age: 22 y.o. MRN: [id number]  No chief complaint on file.   HPI Pt is a 22 yo male with Depression who presents to the clinic for follow up. His mood has been much better since starting prozac. He would like to increase to 40mg . He has a job and doing well. He denies any SI/HC. He is having problems sleeping but that has been something he struggled with since childhood. When he was young he was on clonadine.   .. 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     Objective:     BP 131/88   Pulse (!) 56   Ht 5\' 7"  (1.702 m)   Wt 168 lb (76.2 kg)   SpO2 98%   BMI 26.31 kg/m  BP Readings from Last 3 Encounters:  03/06/23 131/88  01/24/23 123/77  06/07/22 126/75   Wt Readings from Last 3 Encounters:  03/06/23 168 lb (76.2 kg)  01/24/23 169 lb 0.6 oz (76.7 kg)  06/07/22 179 lb (81.2 kg)      Physical Exam Constitutional:      Appearance: Normal appearance.  HENT:     Head: Normocephalic.  Cardiovascular:     Rate and Rhythm: Normal rate.  Pulmonary:     Effort: Pulmonary effort is normal.  Musculoskeletal:     Right lower leg: No edema.     Left lower leg: No edema.  Neurological:      General: No focal deficit present.     Mental Status: He is alert and oriented to person, place, and time.  Psychiatric:        Mood and Affect: Mood normal.        03/06/2023    1:24 PM 01/24/2023   11:19 AM 06/14/2020    3:04 PM 07/31/2017    1:59 PM  Depression screen PHQ 2/9  Decreased Interest 0 2 2 0  Down, Depressed, Hopeless 0 2 2 0  PHQ - 2 Score 0 4 4 0  Altered sleeping 3 3 2    Tired, decreased energy 1 2 2    Change in appetite 1 1 2    Feeling bad or failure about yourself  0 2 2   Trouble concentrating 1 2 2    Moving slowly or fidgety/restless 0 2 2   Suicidal thoughts 0 0 1   PHQ-9 Score 6 16 17    Difficult doing work/chores Not difficult at all Somewhat difficult Somewhat difficult    ..    03/06/2023    1:25 PM 01/24/2023   11:20 AM 06/14/2020    3:04 PM  GAD 7 : Generalized Anxiety Score  Nervous, Anxious, on Edge 0 2 1  Control/stop worrying  0 2 2  Worry too much - different things 1 2 2   Trouble relaxing 1 2 2   Restless 1 2 2   Easily annoyed or irritable 0 3 2  Afraid - awful might happen 0 2 2  Total GAD 7 Score 3 15 13   Anxiety Difficulty Not difficult at all Somewhat difficult Somewhat difficult        Assessment & Plan:  Marland KitchenMarland KitchenDiagnoses and all orders for this visit:  Depression, recurrent (HCC) -     FLUoxetine (PROZAC) 40 MG capsule; Take 1 capsule (40 mg total) by mouth daily.  Primary insomnia -     traZODone (DESYREL) 50 MG tablet; Take 0.5-1 tablets (25-50 mg total) by mouth at bedtime as needed for sleep.   PHQ/GAD improving Pt believes 40mg  will be "sweet spot" Sent 40mg  to pharmacy Trial of trazodone for sleep Follow up in 1 year or as needed   Tandy Gaw, PA-C

## 2023-03-08 ENCOUNTER — Ambulatory Visit: Payer: Medicaid Other | Admitting: Medical-Surgical

## 2023-03-08 ENCOUNTER — Ambulatory Visit: Payer: Medicaid Other | Admitting: Physician Assistant

## 2023-03-12 ENCOUNTER — Encounter: Payer: Self-pay | Admitting: Physician Assistant

## 2023-04-24 ENCOUNTER — Ambulatory Visit (INDEPENDENT_AMBULATORY_CARE_PROVIDER_SITE_OTHER): Payer: Medicaid Other | Admitting: Physician Assistant

## 2023-04-24 ENCOUNTER — Encounter: Payer: Self-pay | Admitting: Physician Assistant

## 2023-04-24 VITALS — BP 126/67 | HR 72 | Temp 97.6°F | Resp 14 | Ht 67.0 in | Wt 172.7 lb

## 2023-04-24 DIAGNOSIS — R6889 Other general symptoms and signs: Secondary | ICD-10-CM | POA: Diagnosis not present

## 2023-04-24 DIAGNOSIS — J069 Acute upper respiratory infection, unspecified: Secondary | ICD-10-CM

## 2023-04-24 LAB — POCT INFLUENZA A/B
Influenza A, POC: NEGATIVE
Influenza B, POC: NEGATIVE

## 2023-04-24 LAB — POC COVID19 BINAXNOW: SARS Coronavirus 2 Ag: NEGATIVE

## 2023-04-24 MED ORDER — HYDROCOD POLI-CHLORPHE POLI ER 10-8 MG/5ML PO SUER: 5.0000 mL | Freq: Two times a day (BID) | ORAL | 0 refills | Status: DC | PRN

## 2023-04-24 MED ORDER — METHYLPREDNISOLONE SODIUM SUCC 40 MG IJ SOLR
120.0000 mg | Freq: Once | INTRAMUSCULAR | Status: AC
  Administered 2023-04-24: 120 mg via INTRAMUSCULAR

## 2023-04-24 NOTE — Patient Instructions (Signed)

## 2023-04-24 NOTE — Progress Notes (Signed)
   Acute Office Visit  Subjective:     Patient ID: Samuel Yates, male    DOB: [birth year], 22 y.o.   MRN: [id number]  Chief Complaint  Patient presents with   Influenza   Covid Exposure    Influenza Associated symptoms include congestion, coughing and a fever. Pertinent negatives include no sore throat.   Patient is in today for congestion, cough, runny nose, and fever starting last night. He wants to be tested for RSV, flu, and COVID. He got the flu vaccine. Had multiple sick contacts over holiday. Denies sore throat. He has been using Dayquil.    Review of Systems  Constitutional:  Positive for fever.  HENT:  Positive for congestion. Negative for ear pain and sore throat.   Respiratory:  Positive for cough.        Objective:    BP 126/67   Pulse 72   Temp 97.6 F (36.4 C)   Resp 14   Ht 5\' 7"  (1.702 m)   Wt 172 lb 11.2 oz (78.3 kg)   SpO2 100%   BMI 27.05 kg/m    Physical Exam HENT:     Right Ear: Ear canal and external ear normal. Tympanic membrane is erythematous.     Left Ear: Ear canal and external ear normal. Tympanic membrane is erythematous.     Nose:     Comments: Nasal turbinates red    Mouth/Throat:     Mouth: Mucous membranes are moist.     Pharynx: No oropharyngeal exudate or posterior oropharyngeal erythema.  Cardiovascular:     Rate and Rhythm: Normal rate and regular rhythm.     Heart sounds: Normal heart sounds.  Pulmonary:     Effort: Pulmonary effort is normal.     Breath sounds: Normal breath sounds.  Lymphadenopathy:     Cervical: No cervical adenopathy.  Neurological:     Mental Status: He is alert.       Assessment & Plan:  .Raymundo was seen today for influenza and covid exposure.  Diagnoses and all orders for this visit:  Viral URI with cough -     chlorpheniramine-HYDROcodone (TUSSIONEX) 10-8 MG/5ML; Take 5 mLs by mouth every 12 (twelve) hours as needed. -     RSV Ag, EIA -     methylPREDNISolone sodium succinate  (SOLU-MEDROL) 40 mg/mL injection 120 mg  Flu-like symptoms -     POC COVID-19 -     POCT Influenza A/B -     chlorpheniramine-HYDROcodone (TUSSIONEX) 10-8 MG/5ML; Take 5 mLs by mouth every 12 (twelve) hours as needed. -     RSV Ag, EIA     Length of illness indicates most likely viral etiology. Negative for flu and covid. RSV ordered, results pending. Solu-medrol injection today in office for airway inflammation. Prescribed Tussionex for cough. Prescribed Flonase for congestion and inflammation. Recommended Mucinex. Written out of work for today and tomorrow   Tandy Gaw, PA-C

## 2023-04-26 ENCOUNTER — Encounter: Payer: Self-pay | Admitting: Physician Assistant

## 2023-04-30 LAB — RSV AG, EIA

## 2023-05-20 ENCOUNTER — Encounter: Payer: Self-pay | Admitting: Physician Assistant

## 2023-05-20 DIAGNOSIS — Z3009 Encounter for other general counseling and advice on contraception: Secondary | ICD-10-CM

## 2023-06-06 ENCOUNTER — Encounter: Payer: Self-pay | Admitting: Urology

## 2023-06-06 ENCOUNTER — Ambulatory Visit: Payer: Medicaid Other | Admitting: Urology

## 2023-06-06 ENCOUNTER — Other Ambulatory Visit (HOSPITAL_BASED_OUTPATIENT_CLINIC_OR_DEPARTMENT_OTHER): Payer: Self-pay

## 2023-06-06 VITALS — BP 118/65 | HR 63 | Ht 66.0 in | Wt 170.0 lb

## 2023-06-06 DIAGNOSIS — Z3009 Encounter for other general counseling and advice on contraception: Secondary | ICD-10-CM

## 2023-06-06 MED ORDER — ALPRAZOLAM 1 MG PO TABS
1.0000 mg | ORAL_TABLET | ORAL | 0 refills | Status: DC
  Filled 2023-06-06: qty 1, 1d supply, fill #0

## 2023-06-06 NOTE — Patient Instructions (Signed)
Taking care of yourself after a VASECTOMY                                              Patient Information Sheet        The following information will reinforce some of the instructions that your doctor has given you.  Day of Procedure: 1) Wear the scrotal supporter and gauze pad 2) Use an ice pack on the scrotum for 15 minutes every hour for 48 hours to help reduce discomfort, swelling and bruising (do NOT place ice directly on your skin, but place on top of the supporter) 3) Expect some clear to pinkish drainage at the surgical site for the first 24-48 hours 4) If needed, use pain medications provided or ibuprofen 800 mg every 8 hours for discomfort 5) Avoid strenuous activities like mowing, lifting, jogging and exercising for 1 week.  Take it easy! 6) If you develop a fever over 101 F or sudden onset of significant swelling within the first 12 hours, please call to report this to your doctor as soon as possible.     Day Two and Three: 1) You may take a shower, but avoid tubs, pools or hot tubs. 2) Continue to wear the scrotal supporter as needed for comfort and change or remove the gauze pad if desired 3) Keep taking it easy!  Avoid strenuous activities like mowing, lifting, jogging and exercising.   4) Continue to watch for signs or symptoms of fever or significant swelling 5) Apply a small amount of antibiotic ointment to incision 1-2 times/day  The rest of the week: 1) Gradually return to normal physical activities after one week.  A return of soreness might mean you are        "doing too much too soon". 2) Avoid sexual activity for 10 days after the procedure 3) Continue to take a shower, but avoid tubs, pools or hot tubs 4) Wearing the scrotal supporter is optional based on your comfort.     Remember to use an alternate form of contraception for 3 months until you have been checked and CLEARED by your urologist!  3-Month lab appointment:  1) The lab technician will need to  look at a semen sample under a microscope  2) Use the specimen cup provided to collect the sample AT HOME 1 hour before the appointment  3) DO NOT refrigerate the specimen, but keep at room or body temperature  4) Avoid ejaculation for 2-5 days before collecting the specimen  5) Collect the entire specimen by masturbation using NO lubricant  6) Make sure your name, MR number, date and time of collection are on the cup  

## 2023-06-06 NOTE — Progress Notes (Signed)
Assessment: 1. Encounter for vasectomy assessment     Plan: Schedule for vasectomy per patient request Rx for alprazolam 1 mg pre-procedure provided.  Chief Complaint:  Chief Complaint  Patient presents with   VAS Consult    History of Present Illness:  Samuel Yates is a 23 y.o. male who is seen for vasectomy evaluation. He is married with 3 children.  No history of scrotal trauma or infection.  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   Nontraumatic rupture of tendon of finger 08/2012   pulley rupture right small finger   Oppositional defiant disorder     Past Surgical History:  Past Surgical History:  Procedure Laterality Date   HAND TENDON SURGERY Right 2010   ORIF FINGER FRACTURE  12/06/2011   Procedure: OPEN REDUCTION INTERNAL FIXATION (ORIF) METACARPAL (FINGER) FRACTURE;  Surgeon: Tami Ribas, MD;  Location: [facility];  Service: Orthopedics;  Laterality: Right;  RIGHT LONG AND RING METACARPAL FRACTURES   TRIGGER FINGER RELEASE Right 09/09/2012   Procedure: RIGHT SMALL FINGER PULLEY RECONSTRUCTION;  Surgeon: Tami Ribas, MD;  Location: [facility];  Service: Orthopedics;  Laterality: Right;    Allergies:  No Known Allergies  Family History:  Family History  Problem Relation Age of Onset   Asthma Brother    Hypertension Maternal Grandmother    Diabetes Maternal Grandfather        diet-controlled   Hypertension Maternal Grandfather    Heart disease Maternal Grandfather        atrial fib., cardiomyopathy    Social History:  Social History   Tobacco Use   Smoking status: Former   Smokeless tobacco: Never   Tobacco comments:    mother smokes outside  Substance Use Topics   Alcohol use: No   Drug use: No    Review of symptoms:  Constitutional:  Negative  for unexplained weight loss, night sweats, fever, chills ENT:  Negative for nose bleeds, sinus pain, painful swallowing CV:  Negative for chest pain, shortness of breath, exercise intolerance, palpitations, loss of consciousness Resp:  Negative for cough, wheezing, shortness of breath GI:  Negative for nausea, vomiting, diarrhea, bloody stools GU:  Positives noted in HPI; otherwise negative for gross hematuria, dysuria, urinary incontinence Neuro:  Negative for seizures, poor balance, limb weakness, slurred speech Psych:  Negative for lack of energy, depression, anxiety Endocrine:  Negative for polydipsia, polyuria, symptoms of hypoglycemia (dizziness, hunger, sweating) Hematologic:  Negative for anemia, purpura, petechia, prolonged or excessive bleeding, use of anticoagulants  Allergic:  Negative for difficulty breathing or choking as a result of exposure to anything; no shellfish allergy; no allergic response (rash/itch) to materials, foods  Physical exam: BP 118/65   Pulse 63   Ht 5\' 6"  (1.676 m)   Wt 170 lb (77.1 kg)   BMI 27.44 kg/m  GENERAL APPEARANCE:  Well appearing, well developed, well nourished, NAD HEENT:  Atraumatic, normocephalic, oropharynx clear NECK:  Supple without lymphadenopathy or thyromegaly ABDOMEN:  Soft, non-tender, no masses EXTREMITIES:  Moves all extremities well, without clubbing, cyanosis, or edema NEUROLOGIC:  Alert and oriented x 3, normal gait, CN II-XII grossly intact MENTAL STATUS:  appropriate BACK:  Non-tender to palpation, No CVAT SKIN:  Warm, dry, and intact GU: Penis:  circumcised Meatus: Normal Scrotum:  vas palpated bilaterally Testis: normal without masses bilateral Epididymis: normal  Results: None  VASECTOMY CONSULTATION  Samuel Yates presents for vasectomy consultation today.  He is a 23 y.o. male, Married with 3  children .  He and his wife have discussed the issues regarding long-term fertility and are comfortable with this  decision.  He presents for consideration for vasectomy.  I discussed the issues in detail with him today and he expressed no reservations.  As to the procedure, no scalpel technique vasectomy is explained and reviewed in detail.  Generalized risks including but not limited to bleeding, infection, orchalgia, testicular atrophy, epididymitis, scrotal hematoma, and chronic pain are discussed.   Additionally, he understands that the possibility of vas recanalization following vasectomy is possible although rare.  Most importantly, the patient understands that he is not sterile initially and will need a semen analysis check to confirm sterility such that no sperm are seen.  He is advised to avoid ejaculation for 10 days following the procedure.  The initial semen analysis will be checked in approximately 12 weeks and in some patients, several months may be required for clearance of all sperm.  He reports a clear understanding of the need for continued birth control until sterility is confirmed.  Otherwise, general issues regarding local anesthesia, prep, alprazolam are discussed and he reports a clear understanding.

## 2023-06-24 ENCOUNTER — Other Ambulatory Visit (HOSPITAL_BASED_OUTPATIENT_CLINIC_OR_DEPARTMENT_OTHER): Payer: Self-pay

## 2023-07-08 ENCOUNTER — Encounter: Payer: Medicaid Other | Admitting: Urology

## 2023-07-08 NOTE — Progress Notes (Deleted)
 Assessment: 1. Encounter for vasectomy     Plan: Post vasectomy instructions given Rx sent Post vasectomy semen analysis in 12 weeks  Chief Complaint:  No chief complaint on file.   History of Present Illness:  Samuel Yates is a 23 y.o. male who is seen for vasectomy. He is married with 3 children.  No history of scrotal trauma or infection.  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   Nontraumatic rupture of tendon of finger 08/2012   pulley rupture right small finger   Oppositional defiant disorder     Past Surgical History:  Past Surgical History:  Procedure Laterality Date   HAND TENDON SURGERY Right 2010   ORIF FINGER FRACTURE  12/06/2011   Procedure: OPEN REDUCTION INTERNAL FIXATION (ORIF) METACARPAL (FINGER) FRACTURE;  Surgeon: Tami Ribas, MD;  Location: [facility];  Service: Orthopedics;  Laterality: Right;  RIGHT LONG AND RING METACARPAL FRACTURES   TRIGGER FINGER RELEASE Right 09/09/2012   Procedure: RIGHT SMALL FINGER PULLEY RECONSTRUCTION;  Surgeon: Tami Ribas, MD;  Location: [facility];  Service: Orthopedics;  Laterality: Right;    Allergies:  No Known Allergies  Family History:  Family History  Problem Relation Age of Onset   Asthma Brother    Hypertension Maternal Grandmother    Diabetes Maternal Grandfather        diet-controlled   Hypertension Maternal Grandfather    Heart disease Maternal Grandfather        atrial fib., cardiomyopathy    Social History:  Social History   Tobacco Use   Smoking status: Former   Smokeless tobacco: Never   Tobacco comments:    mother smokes outside  Substance Use Topics   Alcohol use: No   Drug use: No    ROS: Constitutional:  Negative for fever, chills, weight loss CV: Negative for chest pain, previous  MI, hypertension Respiratory:  Negative for shortness of breath, wheezing, sleep apnea, frequent cough GI:  Negative for nausea, vomiting, bloody stool, GERD  Physical exam: There were no vitals taken for this visit. GENERAL APPEARANCE:  Well appearing, well developed, well nourished, NAD HEENT:  Atraumatic, normocephalic, oropharynx clear NECK:  Supple without lymphadenopathy or thyromegaly ABDOMEN:  Soft, non-tender, no masses EXTREMITIES:  Moves all extremities well, without clubbing, cyanosis, or edema NEUROLOGIC:  Alert and oriented x 3, normal gait, CN II-XII grossly intact MENTAL STATUS:  appropriate BACK:  Non-tender to palpation, No CVAT SKIN:  Warm, dry, and intact  Results: None  VASECTOMY PROCEDURE:  Samuel Yates presents for vasectomy following previous vasectomy consultation and permit is signed.  The patient's anterior scrotal wall is shaved and prepped with Betadine in standard sterile fashion.  1% lidocaine is used as local anesthetic in the scrotal and peri vasal tissue.  A standard median raphe punch incision is made and a no scalpel technique vasectomy is performed.  Bilateral vas are isolated from the peri vasal tissue and an approximately 1 cm segment of vas is excised.  Proximal and distal segments are internally cauterized with electric heat cautery. Interposition of perivasal tissue was performed.  Bilateral palpation confirms bilateral vasectomy defect and no significant bleeding or hematoma is identified.  Neosporin gauze dressing and a scrotal support are applied.    Disposition: Patient is discharged  home with Rx for pain medication and antibiotics.  Patient is given routine vasectomy instructions.   Most importantly, he is instructed and cautioned again regarding the need for protected intercourse until such time that a single  negative semen analysis has been obtained.  The initial semen analysis will be checked in approximately 12  weeks.  The patient  reports a clear understanding.  He will call with any interval questions or concerns.

## 2023-08-05 ENCOUNTER — Encounter: Payer: Medicaid Other | Admitting: Urology

## 2023-08-05 NOTE — Progress Notes (Deleted)
 Assessment: 1. Encounter for vasectomy     Plan: Post vasectomy instructions given Rx sent Post vasectomy semen analysis in 12 weeks  Chief Complaint:  No chief complaint on file.   History of Present Illness:  Samuel Yates is a 23 y.o. male who is seen for vasectomy. He is married with 3 children.  No history of scrotal trauma or infection.  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   Nontraumatic rupture of tendon of finger 08/2012   pulley rupture right small finger   Oppositional defiant disorder     Past Surgical History:  Past Surgical History:  Procedure Laterality Date   HAND TENDON SURGERY Right 2010   ORIF FINGER FRACTURE  12/06/2011   Procedure: OPEN REDUCTION INTERNAL FIXATION (ORIF) METACARPAL (FINGER) FRACTURE;  Surgeon: Tami Ribas, MD;  Location: [facility];  Service: Orthopedics;  Laterality: Right;  RIGHT LONG AND RING METACARPAL FRACTURES   TRIGGER FINGER RELEASE Right 09/09/2012   Procedure: RIGHT SMALL FINGER PULLEY RECONSTRUCTION;  Surgeon: Tami Ribas, MD;  Location: [facility];  Service: Orthopedics;  Laterality: Right;    Allergies:  No Known Allergies  Family History:  Family History  Problem Relation Age of Onset   Asthma Brother    Hypertension Maternal Grandmother    Diabetes Maternal Grandfather        diet-controlled   Hypertension Maternal Grandfather    Heart disease Maternal Grandfather        atrial fib., cardiomyopathy    Social History:  Social History   Tobacco Use   Smoking status: Former   Smokeless tobacco: Never   Tobacco comments:    mother smokes outside  Substance Use Topics   Alcohol use: No   Drug use: No    ROS: Constitutional:  Negative for fever, chills, weight loss CV: Negative for chest pain, previous  MI, hypertension Respiratory:  Negative for shortness of breath, wheezing, sleep apnea, frequent cough GI:  Negative for nausea, vomiting, bloody stool, GERD  Physical exam: There were no vitals taken for this visit. GENERAL APPEARANCE:  Well appearing, well developed, well nourished, NAD HEENT:  Atraumatic, normocephalic, oropharynx clear NECK:  Supple without lymphadenopathy or thyromegaly ABDOMEN:  Soft, non-tender, no masses EXTREMITIES:  Moves all extremities well, without clubbing, cyanosis, or edema NEUROLOGIC:  Alert and oriented x 3, normal gait, CN II-XII grossly intact MENTAL STATUS:  appropriate BACK:  Non-tender to palpation, No CVAT SKIN:  Warm, dry, and intact  Results: None  VASECTOMY PROCEDURE:  Samuel Yates presents for vasectomy following previous vasectomy consultation and permit is signed.  The patient's anterior scrotal wall is shaved and prepped with Betadine in standard sterile fashion.  1% lidocaine is used as local anesthetic in the scrotal and peri vasal tissue.  A standard median raphe punch incision is made and a no scalpel technique vasectomy is performed.  Bilateral vas are isolated from the peri vasal tissue and an approximately 1 cm segment of vas is excised.  Proximal and distal segments are internally cauterized with electric heat cautery. Interposition of perivasal tissue was performed.  Bilateral palpation confirms bilateral vasectomy defect and no significant bleeding or hematoma is identified.  Neosporin gauze dressing and a scrotal support are applied.    Disposition: Patient is discharged  home with Rx for pain medication and antibiotics.  Patient is given routine vasectomy instructions.   Most importantly, he is instructed and cautioned again regarding the need for protected intercourse until such time that a single  negative semen analysis has been obtained.  The initial semen analysis will be checked in approximately 12  weeks.  The patient  reports a clear understanding.  He will call with any interval questions or concerns.

## 2023-10-23 ENCOUNTER — Ambulatory Visit: Admitting: Physician Assistant

## 2024-01-30 ENCOUNTER — Telehealth: Payer: Self-pay | Admitting: Urgent Care

## 2024-01-30 DIAGNOSIS — J069 Acute upper respiratory infection, unspecified: Secondary | ICD-10-CM

## 2024-01-30 DIAGNOSIS — R0981 Nasal congestion: Secondary | ICD-10-CM

## 2024-01-30 DIAGNOSIS — J22 Unspecified acute lower respiratory infection: Secondary | ICD-10-CM

## 2024-01-30 DIAGNOSIS — R6889 Other general symptoms and signs: Secondary | ICD-10-CM

## 2024-01-30 MED ORDER — HYDROCOD POLI-CHLORPHE POLI ER 10-8 MG/5ML PO SUER: 5.0000 mL | Freq: Two times a day (BID) | ORAL | 0 refills | Status: DC | PRN

## 2024-01-30 MED ORDER — FLUTICASONE PROPIONATE 50 MCG/ACT NA SUSP: 2.0000 | Freq: Every day | NASAL | 6 refills | Status: AC

## 2024-01-30 MED ORDER — AZITHROMYCIN 250 MG PO TABS: ORAL_TABLET | ORAL | 0 refills | Status: AC

## 2024-01-30 NOTE — Progress Notes (Unsigned)
 Virtual Visit via Video Note  I connected with Samuel Yates on 01/31/24 at  2:40 PM EDT by a video enabled telemedicine application and verified that I am speaking with the correct person using two identifiers.  Patient Location: Home Provider Location: Office/Clinic  I discussed the limitations, risks, security, and privacy concerns of performing an evaluation and management service by video and the availability of in person appointments. I also discussed with the patient that there may be a patient responsible charge related to this service. The patient expressed understanding and agreed to proceed.   Subjective  PCP: Antoniette Vermell CROME, PA-C  Chief Complaint  Patient presents with   Cough    Congestion, loss of taste, fever    HPI:   Discussed the use of AI scribe software for clinical note transcription with the patient, who gave verbal consent to proceed.  History of Present Illness   Samuel Yates is a 23 year old male who presents with respiratory symptoms and fever.  He has been experiencing respiratory symptoms since Tuesday night, beginning with a severe cough at work. He also noticed a loss of taste, which is gradually returning but remains diminished. His sense of smell is intact.  He experienced a fever reaching 102F last night and continues to have episodes of feeling hot. He describes severe nasal and chest congestion, producing sputum with his cough, along with body aches and a sore throat.  His symptoms began after his four-year-old daughter developed a severe cough, which resolved the next day. The entire household is currently experiencing similar symptoms, and he suspects his daughter may have contracted the illness at school.  He visited the emergency room last night but left due to long wait times. He was tested for COVID-19, flu, and RSV, all results negative. He has been taking over-the-counter medications, including Equate Mucus Relief 1200 mg,  DayQuil, and nyquil.  During the review of symptoms, he confirmed having a sore throat and denied any history of pneumonia. He has no known allergies. He does smoke.      ROS: Per HPI. All other pertinent systems are negative.   Current Outpatient Medications:    azithromycin  (ZITHROMAX ) 250 MG tablet, Take 2 tablets on day 1, then 1 tablet daily on days 2 through 5, Disp: 6 tablet, Rfl: 0   FLUoxetine  (PROZAC ) 40 MG capsule, Take 1 capsule (40 mg total) by mouth daily., Disp: 90 capsule, Rfl: 3   fluticasone  (FLONASE ) 50 MCG/ACT nasal spray, Place 2 sprays into both nostrils daily., Disp: 16 g, Rfl: 6   traZODone  (DESYREL ) 50 MG tablet, Take 0.5-1 tablets (25-50 mg total) by mouth at bedtime as needed for sleep., Disp: 90 tablet, Rfl: 3   ALPRAZolam  (XANAX ) 1 MG tablet, Take 1 tablet (1 mg total) by mouth 1 hour prior to procedure. (Patient not taking: Reported on 01/30/2024), Disp: 1 tablet, Rfl: 0   chlorpheniramine-HYDROcodone  (TUSSIONEX) 10-8 MG/5ML, Take 5 mLs by mouth every 12 (twelve) hours as needed., Disp: 70 mL, Rfl: 0    Objective  Vital signs not able to be obtained due to this being a virtual visit.  Physical Exam Nursing note reviewed.  Constitutional:      General: He is not in acute distress.    Appearance: Normal appearance. He is not ill-appearing, toxic-appearing or diaphoretic.     Comments: Pt is pacing around the house during video encounter with no noted breathlessness and speaking in full sentences. NAD.  Pulmonary:  Effort: Pulmonary effort is normal. No respiratory distress.     Comments: Intermittent cough noted Skin:    Findings: No erythema or rash.  Neurological:     Mental Status: He is alert and oriented to person, place, and time.      Assessment & Plan Lower respiratory infection  Orders:   azithromycin  (ZITHROMAX ) 250 MG tablet; Take 2 tablets on day 1, then 1 tablet daily on days 2 through 5  Flu-like symptoms  Orders:    chlorpheniramine-HYDROcodone  (TUSSIONEX) 10-8 MG/5ML; Take 5 mLs by mouth every 12 (twelve) hours as needed.  Viral URI with cough  Orders:   chlorpheniramine-HYDROcodone  (TUSSIONEX) 10-8 MG/5ML; Take 5 mLs by mouth every 12 (twelve) hours as needed.  Nasal congestion  Orders:   fluticasone  (FLONASE ) 50 MCG/ACT nasal spray; Place 2 sprays into both nostrils daily.   Assessment and Plan    Acute upper respiratory infection with cough, congestion, and fever Acute upper respiratory infection with negative COVID, flu, and RSV tests. Differential includes walking pneumonia. Likely viral etiology given family symptoms, however other family members already improved and pt declining. Discussed false-negative COVID test possibility within 48 hours of symptom onset. - Prescribed azithromycin  for potential bacterial causes (will cover CAP) - Prescribed hydrocodone  cough syrup for nighttime use with drowsiness caution. - Recommended Mucinex for daytime mucus relief. - Prescribed Flonase  for nasal congestion and taste improvement. - Advised rest and increased fluid intake. - Continue PRN ibuprofen / tylenol  as needed for fever - Provided work note for absence until afebrile for 24 hours without medication.  - In person office visit or ER for any new/ worsening sx particularly if CP or SOB occurs.       No follow-ups on file.   I discussed the assessment and treatment plan with the patient. The patient was provided an opportunity to ask questions, and all were answered. The patient agreed with the plan and demonstrated an understanding of the instructions.   The patient was advised to call back or seek an in-person evaluation if the symptoms worsen or if the condition fails to improve as anticipated.  The above assessment and management plan was discussed with the patient. The patient verbalized understanding of and has agreed to the management plan.   Benton LITTIE Gave, PA

## 2024-01-31 ENCOUNTER — Encounter: Payer: Self-pay | Admitting: Urgent Care

## 2024-01-31 NOTE — Patient Instructions (Signed)
 Please start taking the antibiotic, Azithromycin , per package directions. Take 2 the first day, followed by one each day thereafter.  Continue taking mucinex twice daily to help clear up the mucous. Use Flonase  twice daily to help with inflammation of the nasal passage and improve taste/ smell. It is also recommended that you use nasal saline/ sinus washes to cleans the sinus passages. Hot steam from a shower or vaporizer may also be beneficial to help open up the upper airway. Eucalyptus can be helpful.  Use the cough medication only as needed, and preferably only at night at this can cause severe drowsiness. Do not return to work until you have been fever free without medication for at least 24 hours.  If any worsening symptoms such as headache, persistent or non-responsive fever, or shortness of breath, please go to an in person evaluation/ urgent care.

## 2024-02-04 ENCOUNTER — Other Ambulatory Visit (HOSPITAL_BASED_OUTPATIENT_CLINIC_OR_DEPARTMENT_OTHER): Payer: Self-pay

## 2024-02-04 ENCOUNTER — Other Ambulatory Visit: Payer: Self-pay | Admitting: Physician Assistant

## 2024-02-04 DIAGNOSIS — J209 Acute bronchitis, unspecified: Secondary | ICD-10-CM

## 2024-02-04 MED ORDER — PREDNISONE 20 MG PO TABS
ORAL_TABLET | ORAL | 0 refills | Status: AC
  Filled 2024-02-04: qty 20, 13d supply, fill #0

## 2024-02-04 MED ORDER — ALBUTEROL SULFATE HFA 108 (90 BASE) MCG/ACT IN AERS
2.0000 | INHALATION_SPRAY | Freq: Four times a day (QID) | RESPIRATORY_TRACT | 0 refills | Status: AC | PRN
  Filled 2024-02-04: qty 6.7, 25d supply, fill #0

## 2024-02-04 NOTE — Progress Notes (Signed)
 Pt comes into office for a visit for with daughter. He was seen last week and given zpak and tussionex for bronchitis and possible CAP. He is a little better but continues to cough, chest tightness and wheezing. He is a smoker. He is coughing up clear sputum at this point. No fever, chills, body aches.   Wheezing bilateral lungs worse at the base. Productive cough.   Will send prednisone  taper and albuterol  inhaler to use 2 puffs as needed every 4-6 hours.

## 2024-03-23 ENCOUNTER — Telehealth: Payer: Self-pay | Admitting: Physician Assistant

## 2024-03-23 ENCOUNTER — Other Ambulatory Visit (HOSPITAL_BASED_OUTPATIENT_CLINIC_OR_DEPARTMENT_OTHER): Payer: Self-pay

## 2024-03-23 ENCOUNTER — Encounter: Payer: Self-pay | Admitting: Physician Assistant

## 2024-03-23 DIAGNOSIS — J40 Bronchitis, not specified as acute or chronic: Secondary | ICD-10-CM

## 2024-03-23 DIAGNOSIS — J069 Acute upper respiratory infection, unspecified: Secondary | ICD-10-CM | POA: Insufficient documentation

## 2024-03-23 DIAGNOSIS — J329 Chronic sinusitis, unspecified: Secondary | ICD-10-CM

## 2024-03-23 DIAGNOSIS — R6889 Other general symptoms and signs: Secondary | ICD-10-CM

## 2024-03-23 MED ORDER — AZITHROMYCIN 250 MG PO TABS
ORAL_TABLET | ORAL | 0 refills | Status: AC
  Filled 2024-03-23: qty 6, 5d supply, fill #0

## 2024-03-23 MED ORDER — PREDNISONE 20 MG PO TABS
20.0000 mg | ORAL_TABLET | Freq: Every day | ORAL | 0 refills | Status: AC
  Filled 2024-03-23: qty 10, 10d supply, fill #0

## 2024-03-23 MED ORDER — HYDROCOD POLI-CHLORPHE POLI ER 10-8 MG/5ML PO SUER
5.0000 mL | Freq: Two times a day (BID) | ORAL | 0 refills | Status: AC | PRN
  Filled 2024-03-23: qty 70, 7d supply, fill #0

## 2024-03-23 NOTE — Progress Notes (Unsigned)
..  Virtual Visit via Video Note  I connected with Samuel Yates on 03/23/24 at  9:10 AM EST by a video enabled telemedicine application and verified that I am speaking with the correct person using two identifiers.  Location: Patient: home Provider: clinic  .SABRAParticipating in visit:  Patient: Samuel Yates Provider: Vermell Bologna PA-C   I discussed the limitations of evaluation and management by telemedicine and the availability of in person appointments. The patient expressed understanding and agreed to proceed.  History of Present Illness: Pt is a 23 yo male who calls into the clinic with cough, congestion, sinus pressure, headache for the last 5 days. He is taking dayquil and nyquil with no real relief. He is a smoker. He does find the albuterol  inhaler helpful. Not tested for flu or covid. Not had annual flu or covid vaccine. No one else in home is sick.   Observations/Objective: No acute distress Productive cough with green sputum Normal mood and appearance    Assessment and Plan: .SABRARip was seen today for sinus problem.  Diagnoses and all orders for this visit:  Sinobronchitis -     chlorpheniramine-HYDROcodone  (TUSSIONEX) 10-8 MG/5ML; Take 5 mLs by mouth every 12 (twelve) hours as needed. -     predniSONE  (DELTASONE ) 20 MG tablet; Take 1 tablet (20 mg total) by mouth daily with breakfast. -     azithromycin  (ZITHROMAX  Z-PAK) 250 MG tablet; Take 2 tablets (500 mg total) by mouth daily for 1 day, THEN 1 tablet (250 mg total) daily for 4 days.  Flu-like symptoms -     chlorpheniramine-HYDROcodone  (TUSSIONEX) 10-8 MG/5ML; Take 5 mLs by mouth every 12 (twelve) hours as needed.   Encouraged patient to test for flu/covid at home.  Start prednisone  and continue nyquil and dayquil.  Tussionex given for cough at night.  Continue to use rescue inhaler as needed every 4-6 hours.  Rest and hydrate. If not improving add zpak Written out of work for today.   Follow Up  Instructions:    I discussed the assessment and treatment plan with the patient. The patient was provided an opportunity to ask questions and all were answered. The patient agreed with the plan and demonstrated an understanding of the instructions.   The patient was advised to call back or seek an in-person evaluation if the symptoms worsen or if the condition fails to improve as anticipated.   Samuel Courington, PA-C

## 2024-03-24 ENCOUNTER — Encounter: Payer: Self-pay | Admitting: Physician Assistant

## 2024-04-02 ENCOUNTER — Other Ambulatory Visit (HOSPITAL_BASED_OUTPATIENT_CLINIC_OR_DEPARTMENT_OTHER): Payer: Self-pay
